# Patient Record
Sex: Male | Born: 1985 | Race: White | Hispanic: No | Marital: Married | Smoking: Current every day smoker
Health system: Southern US, Community
[De-identification: ages and names within clinical notes are randomized; demographics above are authoritative.]

## PROBLEM LIST (undated history)

## (undated) DIAGNOSIS — I499 Cardiac arrhythmia, unspecified: Secondary | ICD-10-CM

## (undated) HISTORY — PX: WISDOM TOOTH EXTRACTION: SHX21

---
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
## 2008-12-08 ENCOUNTER — Emergency Department (HOSPITAL_COMMUNITY): Admission: EM | Admit: 2008-12-08 | Discharge: 2008-12-08 | Payer: Self-pay | Admitting: Emergency Medicine

## 2012-01-31 ENCOUNTER — Emergency Department (HOSPITAL_COMMUNITY)
Admission: EM | Admit: 2012-01-31 | Discharge: 2012-01-31 | Disposition: A | Discharge status: Home / Self Care | Payer: No Typology Code available for payment source | Attending: Emergency Medicine | Admitting: Emergency Medicine

## 2012-01-31 ENCOUNTER — Encounter (HOSPITAL_COMMUNITY): Payer: Self-pay

## 2012-01-31 DIAGNOSIS — R569 Unspecified convulsions: Secondary | ICD-10-CM | POA: Insufficient documentation

## 2012-01-31 HISTORY — DX: Cardiac arrhythmia, unspecified: I49.9

## 2012-01-31 LAB — COMPREHENSIVE METABOLIC PANEL
ALT: 12 U/L (ref 0–53)
AST: 20 U/L (ref 0–37)
Albumin: 4.5 g/dL (ref 3.5–5.2)
CO2: 25 mEq/L (ref 19–32)
Calcium: 9.8 mg/dL (ref 8.4–10.5)
Creatinine, Ser: 1.09 mg/dL (ref 0.50–1.35)
Sodium: 133 mEq/L — ABNORMAL LOW (ref 135–145)
Total Protein: 7.7 g/dL (ref 6.0–8.3)

## 2012-01-31 LAB — CBC WITH DIFFERENTIAL/PLATELET
Basophils Relative: 0 % (ref 0–1)
Eosinophils Absolute: 0.1 10*3/uL (ref 0.0–0.7)
HCT: 46.3 % (ref 39.0–52.0)
Hemoglobin: 16.4 g/dL (ref 13.0–17.0)
Lymphs Abs: 1.9 10*3/uL (ref 0.7–4.0)
MCH: 31.4 pg (ref 26.0–34.0)
MCHC: 35.4 g/dL (ref 30.0–36.0)
Monocytes Absolute: 0.6 10*3/uL (ref 0.1–1.0)
Monocytes Relative: 8 % (ref 3–12)
Neutrophils Relative %: 64 % (ref 43–77)
RBC: 5.22 MIL/uL (ref 4.22–5.81)

## 2012-01-31 NOTE — ED Notes (Signed)
Per EMS, Pt had witnessed seizure around 1410.  Family sts "it only last 1 minute."  Denies Hx.  Pt is Sinus Tach on monitor and vitals are stable.  Pt was post-ictal on EMS arrival to home and Pt is now A & Ox4.  NAD noted.

## 2012-01-31 NOTE — ED Provider Notes (Signed)
History     CSN: [id number]  Arrival date & time 01/31/12  1457   First MD Initiated Contact with Patient 01/31/12 1511      Chief Complaint  Patient presents with  . Seizures    (Consider location/radiation/quality/duration/timing/severity/associated sxs/prior treatment) HPI Patient had a generalized seizure witnessed by his mother onset approximately 2 PM today. Seizure lasted 2 or 3 minutes per his mother followed by confused presently patient looks and feels at baseline. He admits to taking tramadol 400 mg at 12 noon today "just for fun" no treatment prior to coming here brought by EMS  No past medical history on file. Past medical history negative No past surgical history on file.  No family history on file.  History  Substance Use Topics  . Smoking status: Not on file  . Smokeless tobacco: Not on file  . Alcohol Use: Not on file   Positive smoker occasional alcohol positive marijuana use no other illicit drug use   Review of Systems  Neurological: Positive for seizures.  All other systems reviewed and are negative.    Allergies  Review of patient's allergies indicates not on file.  Home Medications  No current outpatient prescriptions on file.  BP 159/92  Pulse 121  Temp 97.7 F (36.5 C) (Oral)  Resp 16  Ht 6' (1.829 m)  Wt 170 lb (77.111 kg)  BMI 23.06 kg/m2  SpO2 98%  Physical Exam  Nursing note and vitals reviewed. Constitutional: He is oriented to person, place, and time. He appears well-developed and well-nourished.       Glasgow Coma Score15  HENT:  Head: Normocephalic and atraumatic.  Eyes: Conjunctivae normal are normal. Pupils are equal, round, and reactive to light.  Neck: Neck supple. No tracheal deviation present. No thyromegaly present.  Cardiovascular: Regular rhythm.   No murmur heard.      Tachycardic  Pulmonary/Chest: Effort normal and breath sounds normal.  Abdominal: Soft. Bowel sounds are normal. He exhibits no distension.  There is no tenderness.  Musculoskeletal: Normal range of motion. He exhibits no edema and no tenderness.  Neurological: He is alert and oriented to person, place, and time. He has normal reflexes. Coordination normal.  Skin: Skin is warm and dry. No rash noted.  Psychiatric: He has a normal mood and affect.    ED Course  Procedures (including critical care time)  Date: 01/31/2012  Rate: 115  Rhythm: sinus tachycardia  QRS Axis: normal  Intervals: normal  ST/T Wave abnormalities: nonspecific T wave changes  Conduction Disutrbances:none  Narrative Interpretation:   Old EKG Reviewed: changes noted Tracing from 10/24/02 showed normal sinus rhythm within normal limits interpreted by me  Labs Reviewed  COMPREHENSIVE METABOLIC PANEL  CBC WITH DIFFERENTIAL   No results found.   No diagnosis found. Results for orders placed during the hospital encounter of 01/31/12  COMPREHENSIVE METABOLIC PANEL      Component Value Range   Sodium 133 (*) 135 - 145 mEq/L   Potassium 4.7  3.5 - 5.1 mEq/L   Chloride 97  96 - 112 mEq/L   CO2 25  19 - 32 mEq/L   Glucose, Bld 121 (*) 70 - 99 mg/dL   BUN 14  6 - 23 mg/dL   Creatinine, Ser 4.09  0.50 - 1.35 mg/dL   Calcium 9.8  8.4 - 81.1 mg/dL   Total Protein 7.7  6.0 - 8.3 g/dL   Albumin 4.5  3.5 - 5.2 g/dL   AST 20  0 -  37 U/L   ALT 12  0 - 53 U/L   Alkaline Phosphatase 87  39 - 117 U/L   Total Bilirubin 0.3  0.3 - 1.2 mg/dL   GFR calc non Af Amer >90  >90 mL/min   GFR calc Af Amer >90  >90 mL/min  CBC WITH DIFFERENTIAL      Component Value Range   WBC 7.3  4.0 - 10.5 K/uL   RBC 5.22  4.22 - 5.81 MIL/uL   Hemoglobin 16.4  13.0 - 17.0 g/dL   HCT 47.8  29.5 - 62.1 %   MCV 88.7  78.0 - 100.0 fL   MCH 31.4  26.0 - 34.0 pg   MCHC 35.4  30.0 - 36.0 g/dL   RDW 30.8  65.7 - 84.6 %   Platelets 348  150 - 400 K/uL   Neutrophils Relative 64  43 - 77 %   Neutro Abs 4.7  1.7 - 7.7 K/uL   Lymphocytes Relative 26  12 - 46 %   Lymphs Abs 1.9  0.7 -  4.0 K/uL   Monocytes Relative 8  3 - 12 %   Monocytes Absolute 0.6  0.1 - 1.0 K/uL   Eosinophils Relative 1  0 - 5 %   Eosinophils Absolute 0.1  0.0 - 0.7 K/uL   Basophils Relative 0  0 - 1 %   Basophils Absolute 0.0  0.0 - 0.1 K/uL   No results found.  5:30 PM patient alert ambulatory Glasgow Coma Score 15 asymptomatic not lightheaded on standing  MDM  Patient given numbers for drug rehabilitation, although he denies having drug problem. Referral to [facility]. [facility]. Patient may need workup as outpatient for seizures as tramadol lower his seizure threshold he is advised not to drive until evaluated by neurologist. Diagnosis #1 seizure #2 drug abuse        Doug Sou, MD 01/31/12 1738

## 2012-01-31 NOTE — ED Notes (Signed)
Pt escorted to discharge window. Verbalized understanding discharge instructions. In no acute distress.   

## 2012-01-31 NOTE — ED Notes (Signed)
ZOX:[id number]<AV> Expected date:01/31/12<BR> Expected time: 2:51 PM<BR> Means of arrival:<BR> Comments:<BR> Seizure

## 2012-01-31 NOTE — ED Notes (Addendum)
Pt presents after having a witnessed seizure.  Mother sts "it only lasted a minute."  Pt is A & Ox4.  Denies Hx.  Denies pain.  Pt sts he has been taking Tramadol 400mg  per day for recreational use x 2 weeks.

## 2013-10-28 ENCOUNTER — Emergency Department (HOSPITAL_COMMUNITY)
Admission: EM | Admit: 2013-10-28 | Discharge: 2013-10-28 | Disposition: A | Discharge status: Home / Self Care | Payer: No Typology Code available for payment source | Attending: Emergency Medicine | Admitting: Emergency Medicine

## 2013-10-28 ENCOUNTER — Encounter (HOSPITAL_COMMUNITY): Payer: Self-pay | Admitting: Emergency Medicine

## 2013-10-28 DIAGNOSIS — Z8679 Personal history of other diseases of the circulatory system: Secondary | ICD-10-CM | POA: Insufficient documentation

## 2013-10-28 DIAGNOSIS — T23202A Burn of second degree of left hand, unspecified site, initial encounter: Secondary | ICD-10-CM

## 2013-10-28 DIAGNOSIS — Y9389 Activity, other specified: Secondary | ICD-10-CM | POA: Insufficient documentation

## 2013-10-28 DIAGNOSIS — T23201A Burn of second degree of right hand, unspecified site, initial encounter: Secondary | ICD-10-CM

## 2013-10-28 DIAGNOSIS — Y9289 Other specified places as the place of occurrence of the external cause: Secondary | ICD-10-CM | POA: Insufficient documentation

## 2013-10-28 DIAGNOSIS — Z720 Tobacco use: Secondary | ICD-10-CM | POA: Insufficient documentation

## 2013-10-28 DIAGNOSIS — X19XXXA Contact with other heat and hot substances, initial encounter: Secondary | ICD-10-CM | POA: Insufficient documentation

## 2013-10-28 DIAGNOSIS — Z23 Encounter for immunization: Secondary | ICD-10-CM | POA: Insufficient documentation

## 2013-10-28 MED ORDER — HYDROCODONE-ACETAMINOPHEN 5-325 MG PO TABS: ORAL_TABLET | ORAL | Status: AC

## 2013-10-28 MED ORDER — NAPROXEN 500 MG PO TABS: 500.0000 mg | ORAL_TABLET | Freq: Two times a day (BID) | ORAL | Status: AC

## 2013-10-28 MED ORDER — TETANUS-DIPHTH-ACELL PERTUSSIS 5-2.5-18.5 LF-MCG/0.5 IM SUSP
0.5000 mL | Freq: Once | INTRAMUSCULAR | Status: AC
  Administered 2013-10-28: 0.5 mL via INTRAMUSCULAR
  Filled 2013-10-28: qty 0.5

## 2013-10-28 MED ORDER — SILVER SULFADIAZINE 1 % EX CREA
TOPICAL_CREAM | Freq: Once | CUTANEOUS | Status: AC
  Administered 2013-10-28: 17:00:00 via TOPICAL
  Filled 2013-10-28: qty 50

## 2013-10-28 NOTE — ED Provider Notes (Signed)
Medical screening examination/treatment/procedure(s) were performed by non-physician practitioner and as supervising physician I was immediately available for consultation/collaboration.   EKG Interpretation None        Rolan BuccoMelanie Leily Capek, MD 10/28/13 [id number]
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
## 2014-05-19 ENCOUNTER — Emergency Department (HOSPITAL_COMMUNITY): Payer: 59

## 2014-05-19 ENCOUNTER — Encounter (HOSPITAL_COMMUNITY): Payer: Self-pay | Admitting: Emergency Medicine

## 2014-05-19 ENCOUNTER — Emergency Department (HOSPITAL_COMMUNITY)
Admission: EM | Admit: 2014-05-19 | Discharge: 2014-05-19 | Disposition: A | Discharge status: Home / Self Care | Payer: 59 | Attending: Emergency Medicine | Admitting: Emergency Medicine

## 2014-05-19 DIAGNOSIS — W231XXA Caught, crushed, jammed, or pinched between stationary objects, initial encounter: Secondary | ICD-10-CM | POA: Diagnosis not present

## 2014-05-19 DIAGNOSIS — Y9389 Activity, other specified: Secondary | ICD-10-CM | POA: Insufficient documentation

## 2014-05-19 DIAGNOSIS — S6992XA Unspecified injury of left wrist, hand and finger(s), initial encounter: Secondary | ICD-10-CM | POA: Diagnosis present

## 2014-05-19 DIAGNOSIS — Y998 Other external cause status: Secondary | ICD-10-CM | POA: Insufficient documentation

## 2014-05-19 DIAGNOSIS — Z8679 Personal history of other diseases of the circulatory system: Secondary | ICD-10-CM | POA: Diagnosis not present

## 2014-05-19 DIAGNOSIS — Z79899 Other long term (current) drug therapy: Secondary | ICD-10-CM | POA: Diagnosis not present

## 2014-05-19 DIAGNOSIS — S63502A Unspecified sprain of left wrist, initial encounter: Secondary | ICD-10-CM | POA: Diagnosis not present

## 2014-05-19 DIAGNOSIS — Y9289 Other specified places as the place of occurrence of the external cause: Secondary | ICD-10-CM | POA: Insufficient documentation

## 2014-05-19 DIAGNOSIS — Z720 Tobacco use: Secondary | ICD-10-CM | POA: Diagnosis not present

## 2014-05-19 MED ORDER — HYDROCODONE-ACETAMINOPHEN 5-325 MG PO TABS
2.0000 | ORAL_TABLET | Freq: Once | ORAL | Status: AC
  Administered 2014-05-19: 2 via ORAL
  Filled 2014-05-19: qty 2

## 2014-05-19 MED ORDER — NAPROXEN 500 MG PO TABS
500.0000 mg | ORAL_TABLET | Freq: Once | ORAL | Status: DC
  Filled 2014-05-19: qty 1

## 2014-05-19 MED ORDER — IBUPROFEN 600 MG PO TABS: 600.0000 mg | ORAL_TABLET | Freq: Four times a day (QID) | ORAL | Status: AC | PRN

## 2014-05-19 NOTE — Discharge Instructions (Signed)
Recommend ibuprofen  every 6 hours. Ice area of injury 3-4 times per day. Wear a splint for stability. Keep your wrist elevated. Follow up with Dr. Amanda Pea to ensure proper healing.  Wrist Pain Wrist injuries are frequent in adults and children. A sprain is an injury to the ligaments that hold your bones together. A strain is an injury to muscle or muscle cord-like structures (tendons) from stretching or pulling. Generally, when wrists are moderately tender to touch following a fall or injury, a break in the bone (fracture) may be present. Most wrist sprains or strains are better in 3 to 5 days, but complete healing may take several weeks. HOME CARE INSTRUCTIONS   Put ice on the injured area.  Put ice in a plastic bag.  Place a towel between your skin and the bag.  Leave the ice on for 15-20 minutes, 3-4 times a day, for the first 2 days, or as directed by your health care provider.  Keep your arm raised above the level of your heart whenever possible to reduce swelling and pain.  Rest the injured area for at least 48 hours or as directed by your health care provider.  If a splint or elastic bandage has been applied, use it for as long as directed by your health care provider or until seen by a health care provider for a follow-up exam.  Only take over-the-counter or prescription medicines for pain, discomfort, or fever as directed by your health care provider.  Keep all follow-up appointments. You may need to follow up with a specialist or have follow-up X-rays. Improvement in pain level is not a guarantee that you did not fracture a bone in your wrist. The only way to determine whether or not you have a broken bone is by X-ray. SEEK IMMEDIATE MEDICAL CARE IF:   Your fingers are swollen, very red, white, or cold and blue.  Your fingers are numb or tingling.  You have increasing pain.  You have difficulty moving your fingers. MAKE SURE YOU:   Understand these  instructions.  Will watch your condition.  Will get help right away if you are not doing well or get worse. Document Released: 10/22/2004 Document Revised: 01/17/2013 Document Reviewed: 03/05/2010 [facility] Patient Information 2015 [place], [place]. This information is not intended to replace advice given to you by your health care provider. Make sure you discuss any questions you have with your health care provider.  RICE: Routine Care for Injuries The routine care of many injuries includes Rest, Ice, Compression, and Elevation (RICE). HOME CARE INSTRUCTIONS  Rest is needed to allow your body to heal. Routine activities can usually be resumed when comfortable. Injured tendons and bones can take up to 6 weeks to heal. Tendons are the cord-like structures that attach muscle to bone.  Ice following an injury helps keep the swelling down and reduces pain.  Put ice in a plastic bag.  Place a towel between your skin and the bag.  Leave the ice on for 15-20 minutes, 3-4 times a day, or as directed by your health care provider. Do this while awake, for the first 24 to 48 hours. After that, continue as directed by your caregiver.  Compression helps keep swelling down. It also gives support and helps with discomfort. If an elastic bandage has been applied, it should be removed and reapplied every 3 to 4 hours. It should not be applied tightly, but firmly enough to keep swelling down. Watch fingers or toes for swelling, bluish  discoloration, coldness, numbness, or excessive pain. If any of these problems occur, remove the bandage and reapply loosely. Contact your caregiver if these problems continue.  Elevation helps reduce swelling and decreases pain. With extremities, such as the arms, hands, legs, and feet, the injured area should be placed near or above the level of the heart, if possible. SEEK IMMEDIATE MEDICAL CARE IF:  You have persistent pain and swelling.  You develop redness, numbness, or  unexpected weakness.  Your symptoms are getting worse rather than improving after several days. These symptoms may indicate that further evaluation or further X-rays are needed. Sometimes, X-rays may not show a small broken bone (fracture) until 1 week or 10 days later. Make a follow-up appointment with your caregiver. Ask when your X-ray results will be ready. Make sure you get your X-ray results. Document Released: 04/26/2000 Document Revised: 01/17/2013 Document Reviewed: 06/13/2010 [facility] Patient Information 2015 [place], [place]. This information is not intended to replace advice given to you by your health care provider. Make sure you discuss any questions you have with your health care provider.
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
## 2014-05-19 NOTE — ED Notes (Signed)
Quentin ortho tech at bedside 
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
## 2016-03-07 IMAGING — CR DG WRIST COMPLETE 3+V*L*
4 series · 4 of 4 positions shown · non-contrast
Comparison: None.

CLINICAL DATA: Patient slammed wrist in car door today.  Pain.

EXAM:
LEFT WRIST - COMPLETE 3+ VIEW

[x wrist pa left]
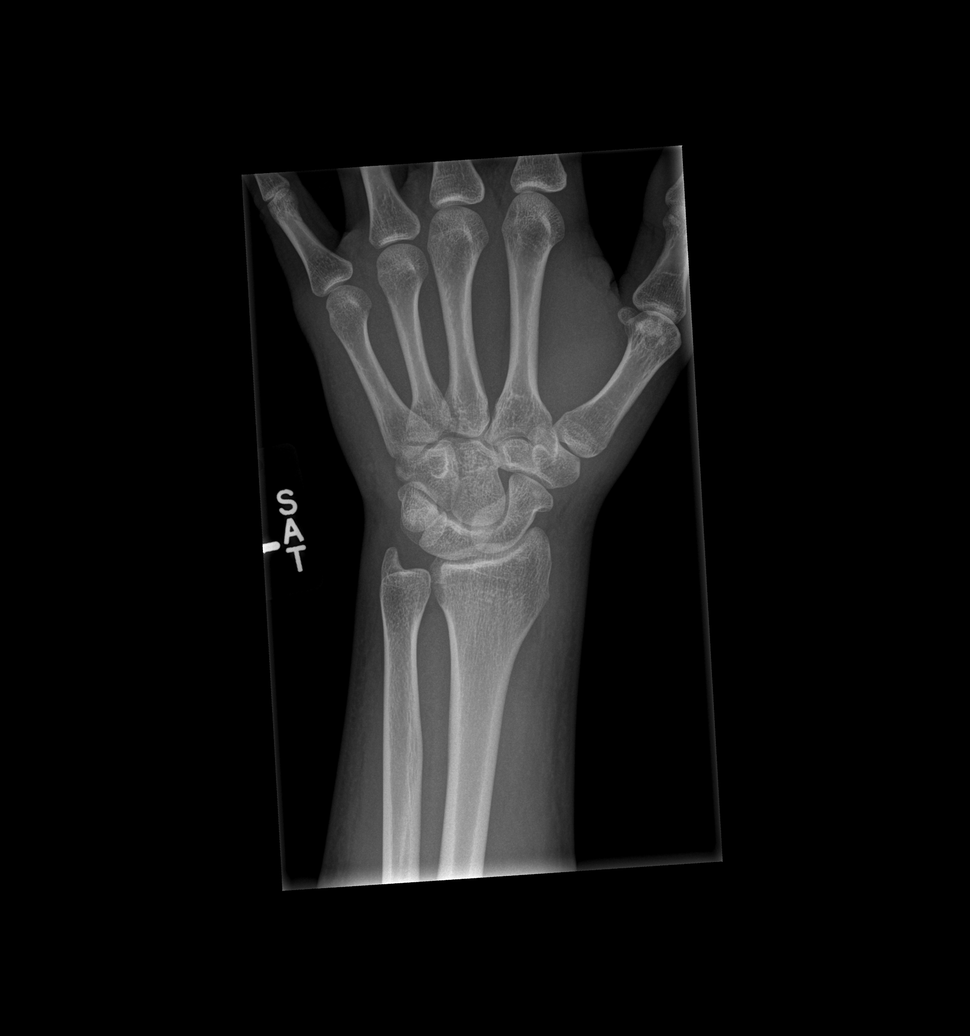

[x wrist obl left]
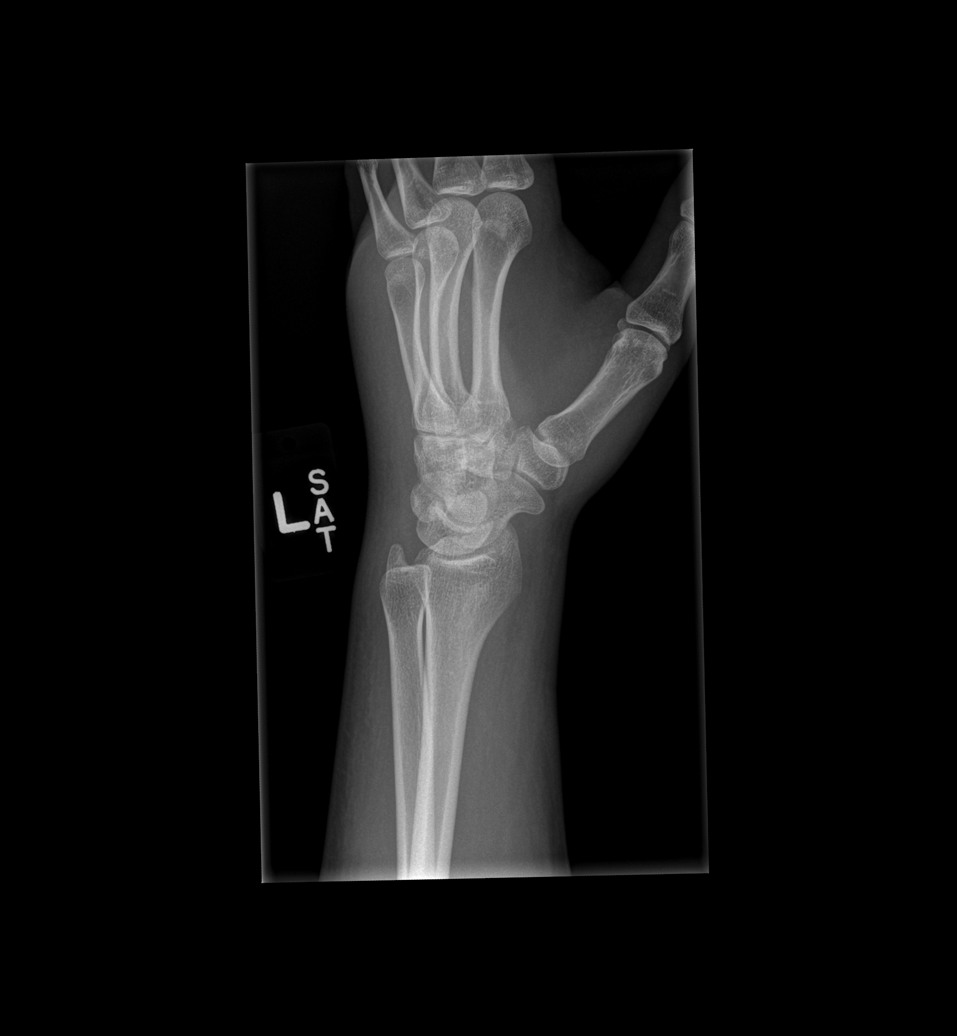

[x wrist lat left]
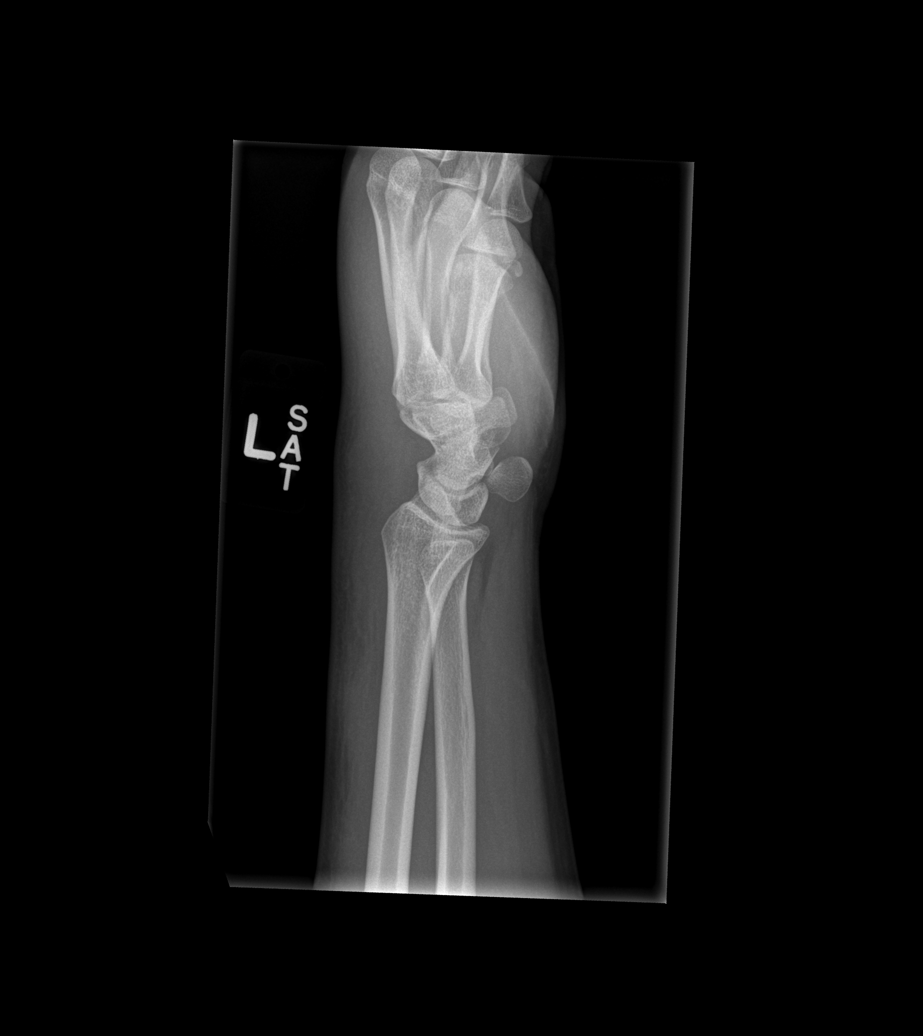

[x wrist navicular view left]
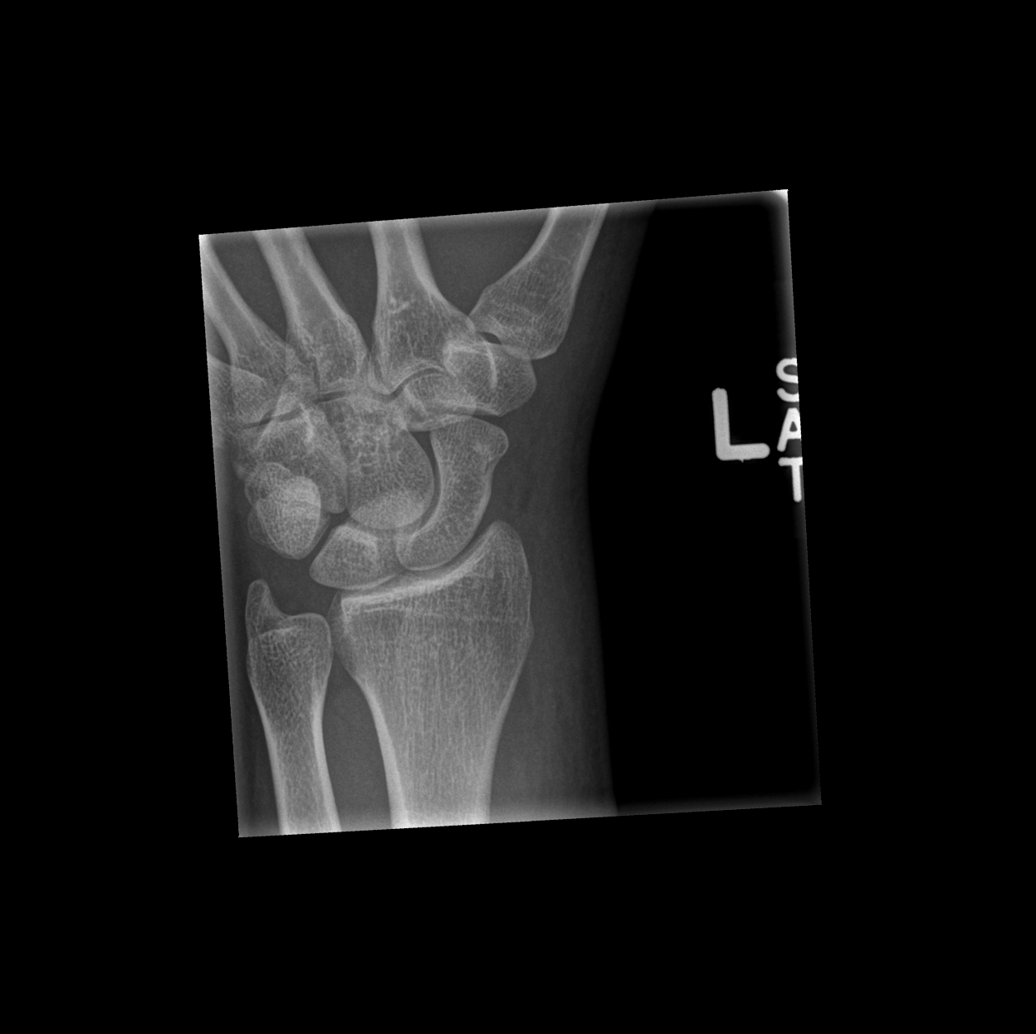

[4 of 4 positions shown; findings below may reference images not displayed]

FINDINGS: There is no evidence of fracture. There is no evidence of
arthropathy or other focal bone abnormality. Soft tissue swelling.
The pisiform appears unusually prominent on the lateral view. This
can be normal but pisiform dislocation not entirely excluded.
Correlate with physical exam.
IMPRESSION: Negative for fracture. Pisiform unusually prominent, difficult to
exclude dislocation.
